# Patient Record
Sex: Female | Born: 1995 | Race: Black or African American | Hispanic: No | Marital: Single | Smoking: Never smoker
Health system: Southern US, Community
[De-identification: ages and names within clinical notes are randomized; demographics above are authoritative.]

## PROBLEM LIST (undated history)

## (undated) DIAGNOSIS — N83201 Unspecified ovarian cyst, right side: Secondary | ICD-10-CM

## (undated) DIAGNOSIS — N83202 Unspecified ovarian cyst, left side: Secondary | ICD-10-CM

## (undated) DIAGNOSIS — I059 Rheumatic mitral valve disease, unspecified: Secondary | ICD-10-CM

---
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
## 2016-10-20 ENCOUNTER — Encounter (HOSPITAL_COMMUNITY): Payer: Self-pay

## 2016-10-20 ENCOUNTER — Ambulatory Visit (HOSPITAL_COMMUNITY)
Admission: EM | Admit: 2016-10-20 | Discharge: 2016-10-20 | Disposition: A | Discharge status: Home / Self Care | Payer: Medicaid Other | Attending: Internal Medicine | Admitting: Internal Medicine

## 2016-10-20 DIAGNOSIS — S46812A Strain of other muscles, fascia and tendons at shoulder and upper arm level, left arm, initial encounter: Secondary | ICD-10-CM

## 2016-10-20 DIAGNOSIS — M7522 Bicipital tendinitis, left shoulder: Secondary | ICD-10-CM | POA: Diagnosis not present

## 2016-10-20 MED ORDER — NAPROXEN 500 MG PO TABS: 500.0000 mg | ORAL_TABLET | Freq: Two times a day (BID) | ORAL | 0 refills | Status: DC

## 2016-10-20 MED ORDER — CYCLOBENZAPRINE HCL 10 MG PO TABS: 10.0000 mg | ORAL_TABLET | Freq: Two times a day (BID) | ORAL | 0 refills | Status: DC | PRN

## 2016-10-20 NOTE — ED Triage Notes (Signed)
[facility], pt states she fell down 15 stairs today due to she just woke up and did turn on lights and tripped and fell down stairs, pt complains of pain in left arm and wrist. Pt has taken tylenol for pain.

## 2016-10-20 NOTE — ED Provider Notes (Signed)
CSN: [id number]     Arrival date & time 10/20/16  1842 History   First MD Initiated Contact with Patient 10/20/16 1909     Chief Complaint  Patient presents with  . Fall   (Consider location/radiation/quality/duration/timing/severity/associated sxs/prior Treatment) Patient c/o fall today down stairs and she is now having some left shoulder and arm discomfort.   The history is provided by the patient.  Fall  This is a new problem. The problem occurs constantly. The problem has not changed since onset.Nothing aggravates the symptoms. Nothing relieves the symptoms. She has tried nothing for the symptoms.    Past Medical History:  Diagnosis Date  . Bilateral ovarian cysts   . Mitral valve problem    History reviewed. No pertinent surgical history. History reviewed. No pertinent family history. Social History  Substance Use Topics  . Smoking status: Never Smoker  . Smokeless tobacco: Never Used  . Alcohol use No   OB History    No data available     Review of Systems  Constitutional: Negative.   HENT: Negative.   Eyes: Negative.   Respiratory: Negative.   Cardiovascular: Negative.   Gastrointestinal: Negative.   Endocrine: Negative.   Genitourinary: Negative.   Musculoskeletal: Positive for arthralgias.  Allergic/Immunologic: Negative.   Neurological: Negative.   Hematological: Negative.   Psychiatric/Behavioral: Negative.     Allergies  Vancomycin  Home Medications   Prior to Admission medications   Medication Sig Start Date End Date Taking? Authorizing Provider  loratadine (CLARITIN) 10 MG tablet Take 10 mg by mouth daily.   Yes [provider]  minocycline (MINOCIN,DYNACIN) 100 MG capsule Take 100 mg by mouth 2 (two) times daily.   Yes [provider]  amoxicillin (AMOXIL) 500 MG capsule Take 1 capsule (500 mg total) by mouth 3 (three) times daily. 06/08/15   Tharon AquasPatrick, Frank C, PA  cyclobenzaprine (FLEXERIL) 10 MG tablet Take 1 tablet (10 mg  total) by mouth 2 (two) times daily as needed for muscle spasms. 10/20/16   Deatra Canterxford, Bard Haupert J, FNP  cyclobenzaprine (FLEXERIL) 5 MG tablet Take 1 tablet (5 mg total) by mouth at bedtime as needed for muscle spasms. 12/26/13   Rodolph Bongorey, Evan S, MD  diclofenac (VOLTAREN) 50 MG EC tablet Take 1 tablet (50 mg total) by mouth 2 (two) times daily as needed. 12/26/13   Rodolph Bongorey, Evan S, MD  naproxen (NAPROSYN) 500 MG tablet Take 1 tablet (500 mg total) by mouth 2 (two) times daily with a meal. 10/20/16   Obryan Radu, Anselm PancoastWilliam J, FNP   Meds Ordered and Administered this Visit  Medications - No data to display  BP 102/66 (BP Location: Right Arm)   Pulse 71   Temp 99.5 F (37.5 C) (Oral)   Resp 16   SpO2 100%  No data found.   Physical Exam  Constitutional: She appears well-developed and well-nourished.  HENT:  Head: Normocephalic and atraumatic.  Eyes: Pupils are equal, round, and reactive to light. Conjunctivae and EOM are normal.  Neck: Normal range of motion. Neck supple.  Cardiovascular: Normal rate, regular rhythm and normal heart sounds.   Pulmonary/Chest: Effort normal and breath sounds normal.  Nursing note and vitals reviewed.   Urgent Care Course     Procedures (including critical care time)  Labs Review Labs Reviewed - No data to display  Imaging Review No results found.   Visual Acuity Review  Right Eye Distance:   Left Eye Distance:   Bilateral Distance:    Right Eye  Near:   Left Eye Near:    Bilateral Near:         MDM   1. Biceps tendonitis on left   2. Strain of left subscapularis muscle, initial encounter    Naprosyn 500mg  one po bid x 10 days Flexeril 10mg  one po bid prn #10    Deatra CanterOxford, Bryah Ocheltree J, FNP 10/20/16 2023

## 2017-05-27 ENCOUNTER — Ambulatory Visit (HOSPITAL_COMMUNITY)
Admission: EM | Admit: 2017-05-27 | Discharge: 2017-05-27 | Disposition: A | Discharge status: Home / Self Care | Payer: Medicaid Other

## 2017-05-27 ENCOUNTER — Encounter (HOSPITAL_COMMUNITY): Payer: Self-pay | Admitting: *Deleted

## 2017-05-27 ENCOUNTER — Other Ambulatory Visit: Payer: Self-pay

## 2017-05-27 DIAGNOSIS — R51 Headache: Secondary | ICD-10-CM

## 2017-05-27 DIAGNOSIS — R202 Paresthesia of skin: Secondary | ICD-10-CM

## 2017-05-27 DIAGNOSIS — M62838 Other muscle spasm: Secondary | ICD-10-CM

## 2017-05-27 DIAGNOSIS — G44209 Tension-type headache, unspecified, not intractable: Secondary | ICD-10-CM

## 2017-05-27 DIAGNOSIS — R200 Anesthesia of skin: Secondary | ICD-10-CM

## 2017-05-27 NOTE — ED Provider Notes (Signed)
  MRN: [id number] DOB: 01/26/1996  Subjective:   Madeline Cowan is a 22 y.o. female presenting for 2 day history of transient stabbing pains over frontal forehead. Has also had numbness in right arm, bilateral leg numbness, difficulty finding words. Reports locking of her thumbs in both hands. Has tried Tizanidine, was [facility] this by her neurologist [facility]. He is managing tension type headaches. Denies fevers, confusion, dizziness, sore throat, chest pain, abdominal pain, rashes. Denies smoking cigarettes. Does not hydrate well.  No current facility-administered medications for this encounter.   Current Outpatient Medications:  .  cyclobenzaprine (FLEXERIL) 5 MG tablet, Take 1 tablet (5 mg total) by mouth at bedtime as needed for muscle spasms., Disp: 20 tablet, Rfl: 0 .  rizatriptan (MAXALT) 10 MG tablet, Take 10 mg by mouth as needed for migraine. May repeat in 2 hours if needed, Disp: , Rfl:  .  verapamil (CALAN) 80 MG tablet, Take 80 mg by mouth 2 (two) times daily., Disp: , Rfl:  .  cyclobenzaprine (FLEXERIL) 10 MG tablet, Take 1 tablet (10 mg total) by mouth 2 (two) times daily as needed for muscle spasms., Disp: 20 tablet, Rfl: 0 .  loratadine (CLARITIN) 10 MG tablet, Take 10 mg by mouth daily., Disp: , Rfl:    Arna Mediciora is allergic to vancomycin.  Arna Mediciora  has a past medical history of Bilateral ovarian cysts and Mitral valve problem. Denies past surgical history.  Objective:   Vitals: BP 124/73 (BP Location: Right Arm)   Pulse 95   Temp 98.8 F (37.1 C) (Oral)   SpO2 100%   Physical Exam  Constitutional: She is oriented to person, place, and time. She appears well-developed and well-nourished.  HENT:  Mouth/Throat: Oropharynx is clear and moist.  Eyes: EOM are normal. Pupils are equal, round, and reactive to light. Right eye exhibits no discharge. Left eye exhibits no discharge. No scleral icterus.  Cardiovascular: Normal rate, regular rhythm and intact distal pulses.  Exam reveals no gallop and no friction rub.  No murmur heard. Pulmonary/Chest: No respiratory distress. She has no wheezes. She has no rales.  Musculoskeletal: She exhibits no edema.  Neurological: She is alert and oriented to person, place, and time. She displays normal reflexes. No cranial nerve deficit. Coordination normal.  Speech intact. Normal heel-to-shin, finger-to-nose tests. Balance intact. Strength 5/5 throughout.  Skin: Skin is warm and dry.  Psychiatric: She has a normal mood and affect.   Assessment and Plan :   Numbness  Tension headache  Muscle spasm  Right sided numbness  No acute findings, physical exam very reassuring. ER precautions given. Recommended adequate hydration, regular balanced meals. Follow up with neurologist.   Wallis BambergMani, Glenys Snader, PA-C 05/27/17 1405

## 2017-05-27 NOTE — Discharge Instructions (Signed)
Hydrate well with at least 2 liters (1 gallon) of water daily. You may take 500mg Tylenol with ibuprofen 400-600mg every 6 hours for pain and inflammation.  °

## 2017-05-27 NOTE — ED Triage Notes (Signed)
numbness in her right leg down to her ankle, per pt she is on new medication for headaches when she wakes up, sharp pains in her head, numbness in her right arm, issues with mixing up letters and sounds of words, both hand fingers get stuck in "weird" positions.

## 2018-11-21 ENCOUNTER — Encounter (HOSPITAL_COMMUNITY): Payer: Self-pay | Admitting: Emergency Medicine

## 2018-11-21 ENCOUNTER — Ambulatory Visit (HOSPITAL_COMMUNITY)
Admission: EM | Admit: 2018-11-21 | Discharge: 2018-11-21 | Disposition: A | Discharge status: Home / Self Care | Payer: Managed Care, Other (non HMO) | Attending: Urgent Care | Admitting: Urgent Care

## 2018-11-21 ENCOUNTER — Other Ambulatory Visit: Payer: Self-pay

## 2018-11-21 DIAGNOSIS — S46912A Strain of unspecified muscle, fascia and tendon at shoulder and upper arm level, left arm, initial encounter: Secondary | ICD-10-CM | POA: Diagnosis not present

## 2018-11-21 DIAGNOSIS — M545 Low back pain, unspecified: Secondary | ICD-10-CM

## 2018-11-21 DIAGNOSIS — M25512 Pain in left shoulder: Secondary | ICD-10-CM

## 2018-11-21 DIAGNOSIS — S39012A Strain of muscle, fascia and tendon of lower back, initial encounter: Secondary | ICD-10-CM | POA: Diagnosis not present

## 2018-11-21 MED ORDER — TIZANIDINE HCL 4 MG PO TABS: 4.0000 mg | ORAL_TABLET | Freq: Three times a day (TID) | ORAL | 0 refills | Status: DC | PRN

## 2018-11-21 MED ORDER — NAPROXEN 500 MG PO TABS: 500.0000 mg | ORAL_TABLET | Freq: Two times a day (BID) | ORAL | 0 refills | Status: DC
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
## 2019-04-01 ENCOUNTER — Ambulatory Visit: Payer: Medicaid Other | Attending: Internal Medicine

## 2019-04-01 DIAGNOSIS — Z20822 Contact with and (suspected) exposure to covid-19: Secondary | ICD-10-CM

## 2019-04-03 LAB — NOVEL CORONAVIRUS, NAA: SARS-CoV-2, NAA: NOT DETECTED

## 2019-04-14 ENCOUNTER — Other Ambulatory Visit: Payer: Medicaid Other

## 2019-12-12 ENCOUNTER — Other Ambulatory Visit: Payer: Medicaid Other

## 2020-04-03 DIAGNOSIS — Z1152 Encounter for screening for COVID-19: Secondary | ICD-10-CM | POA: Diagnosis not present

## 2020-07-26 DIAGNOSIS — Z3041 Encounter for surveillance of contraceptive pills: Secondary | ICD-10-CM | POA: Diagnosis not present

## 2020-12-04 DIAGNOSIS — Z20822 Contact with and (suspected) exposure to covid-19: Secondary | ICD-10-CM | POA: Diagnosis not present

## 2020-12-27 DIAGNOSIS — Z113 Encounter for screening for infections with a predominantly sexual mode of transmission: Secondary | ICD-10-CM | POA: Diagnosis not present

## 2020-12-27 DIAGNOSIS — Z114 Encounter for screening for human immunodeficiency virus [HIV]: Secondary | ICD-10-CM | POA: Diagnosis not present

## 2021-03-24 ENCOUNTER — Encounter (HOSPITAL_COMMUNITY): Payer: Self-pay | Admitting: Emergency Medicine

## 2021-03-24 ENCOUNTER — Emergency Department (HOSPITAL_COMMUNITY)
Admission: EM | Admit: 2021-03-24 | Discharge: 2021-03-25 | Disposition: A | Discharge status: Home / Self Care | Payer: BLUE CROSS/BLUE SHIELD | Attending: Emergency Medicine | Admitting: Emergency Medicine

## 2021-03-24 ENCOUNTER — Other Ambulatory Visit: Payer: Self-pay

## 2021-03-24 DIAGNOSIS — N200 Calculus of kidney: Secondary | ICD-10-CM

## 2021-03-24 DIAGNOSIS — N202 Calculus of kidney with calculus of ureter: Secondary | ICD-10-CM | POA: Insufficient documentation

## 2021-03-24 DIAGNOSIS — Z79899 Other long term (current) drug therapy: Secondary | ICD-10-CM | POA: Diagnosis not present

## 2021-03-24 DIAGNOSIS — R109 Unspecified abdominal pain: Secondary | ICD-10-CM | POA: Diagnosis not present

## 2021-03-24 LAB — CBC WITH DIFFERENTIAL/PLATELET
Abs Immature Granulocytes: 0.01 10*3/uL (ref 0.00–0.07)
Basophils Absolute: 0 10*3/uL (ref 0.0–0.1)
Basophils Relative: 1 %
Eosinophils Absolute: 0 10*3/uL (ref 0.0–0.5)
Eosinophils Relative: 0 %
HCT: 37.9 % (ref 36.0–46.0)
Hemoglobin: 11.7 g/dL — ABNORMAL LOW (ref 12.0–15.0)
Immature Granulocytes: 0 %
Lymphocytes Relative: 40 %
Lymphs Abs: 1.8 10*3/uL (ref 0.7–4.0)
MCH: 27.6 pg (ref 26.0–34.0)
MCHC: 30.9 g/dL (ref 30.0–36.0)
MCV: 89.4 fL (ref 80.0–100.0)
Monocytes Absolute: 0.6 10*3/uL (ref 0.1–1.0)
Monocytes Relative: 12 %
Neutro Abs: 2.2 10*3/uL (ref 1.7–7.7)
Neutrophils Relative %: 47 %
Platelets: 239 10*3/uL (ref 150–400)
RBC: 4.24 MIL/uL (ref 3.87–5.11)
RDW: 14.1 % (ref 11.5–15.5)
WBC: 4.6 10*3/uL (ref 4.0–10.5)
nRBC: 0 % (ref 0.0–0.2)

## 2021-03-24 LAB — I-STAT BETA HCG BLOOD, ED (MC, WL, AP ONLY): I-stat hCG, quantitative: <5 m[IU]/mL (ref <5)

## 2021-03-24 NOTE — ED Triage Notes (Signed)
Patient reports RLQ abdominal pain with nausea and diarrhea/constipation onset yesterday .

## 2021-03-24 NOTE — ED Provider Notes (Signed)
Emergency Medicine Provider Triage Evaluation Note  Madeline Cowan , a 25 y.o. female  was evaluated in triage.  Pt complains of RLQ abdominal pain.  States subtle pain for the past 2 days but worse now.  She reports associated diarrhea and chills.  No fever. No prior surgeries..  Review of Systems  Positive: RLQ pain Negative: fever  Physical Exam  BP (!) 137/97    Pulse 80    Temp 98.6 F (37 C) (Oral)    Resp 16    Ht 5\' 6"  (1.676 m)    Wt 63 kg    LMP 03/07/2021    SpO2 100%    BMI 22.42 kg/m  Gen:   Awake, no distress   Resp:  Normal effort  MSK:   Moves extremities without difficulty  Other:  RLQ pain   Medical Decision Making  Medically screening exam initiated at 10:49 PM.  Appropriate orders placed.  Latania Bascomb was informed that the remainder of the evaluation will be completed by another provider, this initial triage assessment does not replace that evaluation, and the importance of remaining in the ED until their evaluation is complete.  RLQ pain.  Seems uncomfortable in triage.  Will check labs, CT for appendicitis.   Guy Sandifer, PA-C 03/24/21 2251    03/26/21, MD 03/25/21 [id number]

## 2021-03-25 ENCOUNTER — Emergency Department (HOSPITAL_COMMUNITY): Payer: BLUE CROSS/BLUE SHIELD

## 2021-03-25 DIAGNOSIS — N200 Calculus of kidney: Secondary | ICD-10-CM | POA: Diagnosis not present

## 2021-03-25 DIAGNOSIS — R109 Unspecified abdominal pain: Secondary | ICD-10-CM | POA: Diagnosis not present

## 2021-03-25 LAB — COMPREHENSIVE METABOLIC PANEL
ALT: 18 U/L (ref 0–44)
AST: 17 U/L (ref 15–41)
Albumin: 4.2 g/dL (ref 3.5–5.0)
Alkaline Phosphatase: 67 U/L (ref 38–126)
Anion gap: 7 (ref 5–15)
BUN: 8 mg/dL (ref 6–20)
CO2: 25 mmol/L (ref 22–32)
Calcium: 9.7 mg/dL (ref 8.9–10.3)
Chloride: 105 mmol/L (ref 98–111)
Creatinine, Ser: 0.9 mg/dL (ref 0.44–1.00)
GFR, Estimated: >60 mL/min (ref >60)
Glucose, Bld: 98 mg/dL (ref 70–99)
Potassium: 3.7 mmol/L (ref 3.5–5.1)
Sodium: 137 mmol/L (ref 135–145)
Total Bilirubin: 0.6 mg/dL (ref 0.3–1.2)
Total Protein: 6.9 g/dL (ref 6.5–8.1)

## 2021-03-25 LAB — URINALYSIS, ROUTINE W REFLEX MICROSCOPIC
Bacteria, UA: NONE SEEN
Bilirubin Urine: NEGATIVE
Glucose, UA: NEGATIVE mg/dL
Ketones, ur: 5 mg/dL — AB
Leukocytes,Ua: NEGATIVE
Nitrite: NEGATIVE
Protein, ur: 100 mg/dL — AB
RBC / HPF: >50 RBC/hpf — ABNORMAL HIGH (ref 0–5)
Specific Gravity, Urine: 1.034 — ABNORMAL HIGH (ref 1.005–1.030)
pH: 5 (ref 5.0–8.0)

## 2021-03-25 LAB — LIPASE, BLOOD: Lipase: 35 U/L (ref 11–51)

## 2021-03-25 MED ORDER — ONDANSETRON 8 MG PO TBDP: 8.0000 mg | ORAL_TABLET | Freq: Three times a day (TID) | ORAL | 0 refills | Status: DC | PRN

## 2021-03-25 MED ORDER — IOHEXOL 300 MG/ML  SOLN
100.0000 mL | Freq: Once | INTRAMUSCULAR | Status: AC | PRN
  Administered 2021-03-25: 03:00:00 100 mL via INTRAVENOUS

## 2021-03-25 MED ORDER — KETOROLAC TROMETHAMINE 30 MG/ML IJ SOLN
30.0000 mg | Freq: Once | INTRAMUSCULAR | Status: AC
  Administered 2021-03-25: 07:00:00 30 mg via INTRAVENOUS
  Filled 2021-03-25: qty 1

## 2021-03-25 MED ORDER — ONDANSETRON HCL 4 MG/2ML IJ SOLN
4.0000 mg | Freq: Once | INTRAMUSCULAR | Status: AC
  Administered 2021-03-25: 07:00:00 4 mg via INTRAVENOUS
  Filled 2021-03-25: qty 2

## 2021-03-25 MED ORDER — HYDROCODONE-ACETAMINOPHEN 5-325 MG PO TABS: 1.0000 | ORAL_TABLET | Freq: Four times a day (QID) | ORAL | 0 refills | Status: DC | PRN

## 2021-03-25 MED ORDER — OXYCODONE-ACETAMINOPHEN 5-325 MG PO TABS
1.0000 | ORAL_TABLET | Freq: Once | ORAL | Status: AC
  Administered 2021-03-25: 07:00:00 1 via ORAL
  Filled 2021-03-25: qty 1

## 2021-03-25 NOTE — Discharge Instructions (Addendum)
Use the urine strainer as we discussed.  Follow-up with urologist if your symptoms persist.  Take the medications for pain and nausea as needed.

## 2021-03-25 NOTE — ED Provider Notes (Signed)
[facility] EMERGENCY DEPARTMENT Provider Note   CSN: [id number] Arrival date & time: 03/24/21  2209     History Chief Complaint  Patient presents with   Abdominal Pain    Madeline Cowan is a 25 y.o. female.   Abdominal Pain  Patient presented to the emergency room with complaints of abdominal pain.  Patient has had some discomfort over the last couple of weeks with it was rather mild.  Last night however she had sharp intense severe pain.  The pains in the right lower part of her abdomen.  She has had some pain in the back as well.  She had some nausea but no vomiting.  No fevers or chills.  Patient has never had anything like this before  Past Medical History:  Diagnosis Date   Bilateral ovarian cysts    Mitral valve problem     There are no problems to display for this patient.   History reviewed. No pertinent surgical history.   OB History   No obstetric history on file.     No family history on file.  Social History   Tobacco Use   Smoking status: Never   Smokeless tobacco: Never  Vaping Use   Vaping Use: Never used  Substance Use Topics   Alcohol use: No   Drug use: No    Home Medications Prior to Admission medications   Medication Sig Start Date End Date Taking? Authorizing Provider  Aspirin-Acetaminophen-Caffeine (PAMPRIN MAX PO) Take 1 tablet by mouth daily as needed (pain, cramps).   Yes [provider]  HYDROcodone-acetaminophen (NORCO/VICODIN) 5-325 MG tablet Take 1 tablet by mouth every 6 (six) hours as needed. 03/25/21  Yes Linwood Dibbles, MD  ondansetron (ZOFRAN-ODT) 8 MG disintegrating tablet Take 1 tablet (8 mg total) by mouth every 8 (eight) hours as needed for nausea or vomiting. 03/25/21  Yes Linwood Dibbles, MD  cyclobenzaprine (FLEXERIL) 10 MG tablet Take 1 tablet (10 mg total) by mouth 2 (two) times daily as needed for muscle spasms. Patient not taking: Reported on 11/21/2018 10/20/16   Deatra Canter, FNP   cyclobenzaprine (FLEXERIL) 5 MG tablet Take 1 tablet (5 mg total) by mouth at bedtime as needed for muscle spasms. Patient not taking: Reported on 11/21/2018 12/26/13   Rodolph Bong, MD  loratadine (CLARITIN) 10 MG tablet Take 10 mg by mouth daily. Patient not taking: Reported on 03/25/2021    [provider]  naproxen (NAPROSYN) 500 MG tablet Take 1 tablet (500 mg total) by mouth 2 (two) times daily. Patient not taking: Reported on 03/25/2021 11/21/18   Wallis Bamberg, PA-C  rizatriptan (MAXALT) 10 MG tablet Take 10 mg by mouth as needed for migraine. May repeat in 2 hours if needed Patient not taking: Reported on 03/25/2021    [provider]  tiZANidine (ZANAFLEX) 4 MG tablet Take 1 tablet (4 mg total) by mouth every 8 (eight) hours as needed for muscle spasms. Patient not taking: Reported on 03/25/2021 11/21/18   Wallis Bamberg, PA-C  verapamil (CALAN) 80 MG tablet Take 80 mg by mouth 2 (two) times daily. Patient not taking: Reported on 03/25/2021    [provider]    Allergies    Vancomycin  Review of Systems   Review of Systems  Gastrointestinal:  Positive for abdominal pain.  All other systems reviewed and are negative.  Physical Exam Updated Vital Signs BP (!) 115/47    Pulse 62    Temp 98.5 F (36.9 C) (  Oral)    Resp 16    Ht 1.676 m (5\' 6" )    Wt 63 kg    LMP 03/07/2021    SpO2 100%    BMI 22.42 kg/m   Physical Exam Vitals and nursing note reviewed.  Constitutional:      General: She is not in acute distress.    Appearance: She is well-developed.  HENT:     Head: Normocephalic and atraumatic.     Right Ear: External ear normal.     Left Ear: External ear normal.  Eyes:     General: No scleral icterus.       Right eye: No discharge.        Left eye: No discharge.     Conjunctiva/sclera: Conjunctivae normal.  Neck:     Trachea: No tracheal deviation.  Cardiovascular:     Rate and Rhythm: Normal rate and regular rhythm.  Pulmonary:      Effort: Pulmonary effort is normal. No respiratory distress.     Breath sounds: Normal breath sounds. No stridor. No wheezing or rales.  Abdominal:     General: Bowel sounds are normal. There is no distension.     Palpations: Abdomen is soft.     Tenderness: There is no abdominal tenderness. There is no guarding or rebound.  Musculoskeletal:        General: No tenderness or deformity.     Cervical back: Neck supple.  Skin:    General: Skin is warm and dry.     Findings: No rash.  Neurological:     General: No focal deficit present.     Mental Status: She is alert.     Cranial Nerves: No cranial nerve deficit (no facial droop, extraocular movements intact, no slurred speech).     Sensory: No sensory deficit.     Motor: No abnormal muscle tone or seizure activity.     Coordination: Coordination normal.  Psychiatric:        Mood and Affect: Mood normal.    ED Results / Procedures / Treatments   Labs (all labs ordered are listed, but only abnormal results are displayed) Labs Reviewed  CBC WITH DIFFERENTIAL/PLATELET - Abnormal; Notable for the following components:      Result Value   Hemoglobin 11.7 (*)    All other components within normal limits  URINALYSIS, ROUTINE W REFLEX MICROSCOPIC - Abnormal; Notable for the following components:   Color, Urine AMBER (*)    APPearance CLOUDY (*)    Specific Gravity, Urine 1.034 (*)    Hgb urine dipstick LARGE (*)    Ketones, ur 5 (*)    Protein, ur 100 (*)    RBC / HPF >50 (*)    All other components within normal limits  COMPREHENSIVE METABOLIC PANEL  LIPASE, BLOOD  I-STAT BETA HCG BLOOD, ED ([facility], [facility], [facility] ONLY)    EKG None  Radiology CT ABDOMEN PELVIS W CONTRAST  Result Date: 03/25/2021 CLINICAL DATA:  Right lower quadrant abdominal pain. EXAM: CT ABDOMEN AND PELVIS WITH CONTRAST TECHNIQUE: Multidetector CT imaging of the abdomen and pelvis was performed using the standard protocol following bolus administration of intravenous  contrast. CONTRAST:  03/27/2021 OMNIPAQUE IOHEXOL 300 MG/ML  SOLN COMPARISON:  None FINDINGS: Lower chest: The visualized lung bases are clear. No intra-abdominal free air or free fluid. Hepatobiliary: No focal liver abnormality is seen. No gallstones, gallbladder wall thickening, or biliary dilatation. Pancreas: Unremarkable. No pancreatic ductal dilatation or surrounding inflammatory changes. Spleen: Normal in  size without focal abnormality. Adrenals/Urinary Tract: The adrenal glands unremarkable. There is a 4 mm stone in the proximal right ureter with mild right hydronephrosis. Several additional nonobstructing right renal calculi measure up to mm. The left kidney is unremarkable. The urinary bladder is grossly unremarkable. Stomach/Bowel: There is no bowel obstruction or active inflammation. The appendix is normal. Vascular/Lymphatic: The abdominal aorta and IVC unremarkable. No portal venous gas. There is no adenopathy. Reproductive: The uterus is anteverted.  No adnexal masses. Other: None Musculoskeletal: No acute or significant osseous findings. IMPRESSION: 1. A 4 mm proximal right ureteral stone with mild right hydronephrosis. Several additional nonobstructing right renal calculi measure up to mm. 2. No bowel obstruction. Normal appendix. Electronically Signed   By: Elgie Collard M.D.   On: 03/25/2021 02:37    Procedures Procedures   Medications Ordered in ED Medications  iohexol (OMNIPAQUE) 300 MG/ML solution 100 mL (100 mLs Intravenous Contrast Given 03/25/21 0230)  oxyCODONE-acetaminophen (PERCOCET/ROXICET) 5-325 MG per tablet 1 tablet (1 tablet Oral Given 03/25/21 3154)  ketorolac (TORADOL) 30 MG/ML injection 30 mg (30 mg Intravenous Given 03/25/21 0723)  ondansetron (ZOFRAN) injection 4 mg (4 mg Intravenous Given 03/25/21 0086)    ED Course  I have reviewed the triage vital signs and the nursing notes.  Pertinent labs & imaging results that were available during my care of the patient  were reviewed by me and considered in my medical decision making (see chart for details).    MDM Rules/Calculators/A&P                         Patient presented to the ER for evaluation of right-sided abdominal pain.  Presentation concerning for the possibility of appendicitis.  Ureteral colic was also a consideration.  Patient's laboratory test show normal CBC and metabolic panel.  No signs of pancreatitis or hepatitis.  Patient is not pregnant.  Urinalysis did show large amount of blood but no signs of infection.  CT scan shows a right-sided ureteral stone.  Patient's symptoms have mostly resolved while she was waiting for evaluation.  Patient was given a dose of Toradol and Zofran.  Discussed findings with the patient and the fact that she does have other kidney stones.  Will discharge home with medications for pain and nausea.  Discussed outpatient follow-up with urology and use the urine strainer.    Final Clinical Impression(s) / ED Diagnoses Final diagnoses:  Kidney stone    Rx / DC Orders ED Discharge Orders          Ordered    HYDROcodone-acetaminophen (NORCO/VICODIN) 5-325 MG tablet  Every 6 hours PRN        03/25/21 0737    ondansetron (ZOFRAN-ODT) 8 MG disintegrating tablet  Every 8 hours PRN        03/25/21 0737             Linwood Dibbles, MD 03/25/21 [phone]

## 2021-03-29 DIAGNOSIS — U071 COVID-19: Secondary | ICD-10-CM | POA: Diagnosis not present

## 2021-03-29 DIAGNOSIS — Z20822 Contact with and (suspected) exposure to covid-19: Secondary | ICD-10-CM | POA: Diagnosis not present

## 2021-04-02 DIAGNOSIS — Z20822 Contact with and (suspected) exposure to covid-19: Secondary | ICD-10-CM | POA: Diagnosis not present

## 2021-04-02 DIAGNOSIS — Z03818 Encounter for observation for suspected exposure to other biological agents ruled out: Secondary | ICD-10-CM | POA: Diagnosis not present

## 2021-04-28 DIAGNOSIS — R109 Unspecified abdominal pain: Secondary | ICD-10-CM | POA: Diagnosis not present

## 2021-04-28 DIAGNOSIS — D649 Anemia, unspecified: Secondary | ICD-10-CM | POA: Diagnosis not present

## 2021-04-28 DIAGNOSIS — Z87442 Personal history of urinary calculi: Secondary | ICD-10-CM | POA: Diagnosis not present

## 2021-04-28 DIAGNOSIS — R1031 Right lower quadrant pain: Secondary | ICD-10-CM | POA: Diagnosis not present

## 2021-04-28 DIAGNOSIS — Z23 Encounter for immunization: Secondary | ICD-10-CM | POA: Diagnosis not present

## 2021-04-29 DIAGNOSIS — D72819 Decreased white blood cell count, unspecified: Secondary | ICD-10-CM | POA: Insufficient documentation

## 2021-05-17 DIAGNOSIS — D72819 Decreased white blood cell count, unspecified: Secondary | ICD-10-CM | POA: Diagnosis not present

## 2021-07-07 DIAGNOSIS — D649 Anemia, unspecified: Secondary | ICD-10-CM | POA: Diagnosis not present

## 2021-07-07 DIAGNOSIS — Z0000 Encounter for general adult medical examination without abnormal findings: Secondary | ICD-10-CM | POA: Diagnosis not present

## 2021-07-07 DIAGNOSIS — Z30013 Encounter for initial prescription of injectable contraceptive: Secondary | ICD-10-CM | POA: Diagnosis not present

## 2021-07-07 DIAGNOSIS — R1031 Right lower quadrant pain: Secondary | ICD-10-CM | POA: Diagnosis not present

## 2021-07-07 DIAGNOSIS — Z23 Encounter for immunization: Secondary | ICD-10-CM | POA: Diagnosis not present

## 2021-07-07 DIAGNOSIS — Z13220 Encounter for screening for lipoid disorders: Secondary | ICD-10-CM | POA: Diagnosis not present

## 2021-07-07 DIAGNOSIS — Z124 Encounter for screening for malignant neoplasm of cervix: Secondary | ICD-10-CM | POA: Diagnosis not present

## 2021-07-07 DIAGNOSIS — Z113 Encounter for screening for infections with a predominantly sexual mode of transmission: Secondary | ICD-10-CM | POA: Diagnosis not present

## 2021-07-08 DIAGNOSIS — Z3042 Encounter for surveillance of injectable contraceptive: Secondary | ICD-10-CM | POA: Diagnosis not present

## 2021-08-03 DIAGNOSIS — N202 Calculus of kidney with calculus of ureter: Secondary | ICD-10-CM | POA: Diagnosis not present

## 2021-08-08 DIAGNOSIS — R5383 Other fatigue: Secondary | ICD-10-CM | POA: Diagnosis not present

## 2021-08-08 DIAGNOSIS — D509 Iron deficiency anemia, unspecified: Secondary | ICD-10-CM | POA: Diagnosis not present

## 2021-08-10 ENCOUNTER — Telehealth: Payer: Self-pay | Admitting: Hematology and Oncology

## 2021-08-10 NOTE — Telephone Encounter (Signed)
Scheduled appt per 5/17 referral. Pt is aware of appt date and time. Pt is aware to arrive 15 mins prior to appt time and to bring and updated insurance card. Pt is aware of appt location.   

## 2021-08-24 ENCOUNTER — Other Ambulatory Visit: Payer: Self-pay

## 2021-08-24 ENCOUNTER — Inpatient Hospital Stay: Payer: BC Managed Care – PPO | Attending: Hematology and Oncology | Admitting: Hematology and Oncology

## 2021-08-24 ENCOUNTER — Inpatient Hospital Stay: Payer: BC Managed Care – PPO

## 2021-08-24 VITALS — BP 114/62 | HR 62 | Temp 97.8°F | Resp 15 | Ht 66.0 in | Wt 125.5 lb

## 2021-08-24 DIAGNOSIS — D709 Neutropenia, unspecified: Secondary | ICD-10-CM

## 2021-08-24 DIAGNOSIS — D509 Iron deficiency anemia, unspecified: Secondary | ICD-10-CM

## 2021-08-24 DIAGNOSIS — Z87442 Personal history of urinary calculi: Secondary | ICD-10-CM | POA: Insufficient documentation

## 2021-08-24 DIAGNOSIS — Z8616 Personal history of COVID-19: Secondary | ICD-10-CM | POA: Insufficient documentation

## 2021-08-24 DIAGNOSIS — Z793 Long term (current) use of hormonal contraceptives: Secondary | ICD-10-CM | POA: Insufficient documentation

## 2021-08-24 DIAGNOSIS — R634 Abnormal weight loss: Secondary | ICD-10-CM | POA: Insufficient documentation

## 2021-08-24 DIAGNOSIS — N920 Excessive and frequent menstruation with regular cycle: Secondary | ICD-10-CM | POA: Insufficient documentation

## 2021-08-24 DIAGNOSIS — Z87891 Personal history of nicotine dependence: Secondary | ICD-10-CM | POA: Diagnosis not present

## 2021-08-24 DIAGNOSIS — D500 Iron deficiency anemia secondary to blood loss (chronic): Secondary | ICD-10-CM | POA: Insufficient documentation

## 2021-08-24 DIAGNOSIS — D72819 Decreased white blood cell count, unspecified: Secondary | ICD-10-CM | POA: Diagnosis not present

## 2021-08-24 LAB — CBC WITH DIFFERENTIAL (CANCER CENTER ONLY)
Abs Immature Granulocytes: 0 10*3/uL (ref 0.00–0.07)
Basophils Absolute: 0 10*3/uL (ref 0.0–0.1)
Basophils Relative: 2 %
Eosinophils Absolute: 0.1 10*3/uL (ref 0.0–0.5)
Eosinophils Relative: 5 %
HCT: 37.1 % (ref 36.0–46.0)
Hemoglobin: 12 g/dL (ref 12.0–15.0)
Immature Granulocytes: 0 %
Lymphocytes Relative: 45 %
Lymphs Abs: 1.2 10*3/uL (ref 0.7–4.0)
MCH: 28.4 pg (ref 26.0–34.0)
MCHC: 32.3 g/dL (ref 30.0–36.0)
MCV: 87.7 fL (ref 80.0–100.0)
Monocytes Absolute: 0.2 10*3/uL (ref 0.1–1.0)
Monocytes Relative: 8 %
Neutro Abs: 1.1 10*3/uL — ABNORMAL LOW (ref 1.7–7.7)
Neutrophils Relative %: 40 %
Platelet Count: 228 10*3/uL (ref 150–400)
RBC: 4.23 MIL/uL (ref 3.87–5.11)
RDW: 13.2 % (ref 11.5–15.5)
WBC Count: 2.6 10*3/uL — ABNORMAL LOW (ref 4.0–10.5)
nRBC: 0 % (ref 0.0–0.2)

## 2021-08-24 LAB — CMP (CANCER CENTER ONLY)
ALT: 14 U/L (ref 0–44)
AST: 14 U/L — ABNORMAL LOW (ref 15–41)
Albumin: 4.7 g/dL (ref 3.5–5.0)
Alkaline Phosphatase: 51 U/L (ref 38–126)
Anion gap: 5 (ref 5–15)
BUN: 8 mg/dL (ref 6–20)
CO2: 28 mmol/L (ref 22–32)
Calcium: 10.2 mg/dL (ref 8.9–10.3)
Chloride: 106 mmol/L (ref 98–111)
Creatinine: 0.79 mg/dL (ref 0.44–1.00)
GFR, Estimated: >60 mL/min (ref >60)
Glucose, Bld: 97 mg/dL (ref 70–99)
Potassium: 3.8 mmol/L (ref 3.5–5.1)
Sodium: 139 mmol/L (ref 135–145)
Total Bilirubin: 0.5 mg/dL (ref 0.3–1.2)
Total Protein: 7.5 g/dL (ref 6.5–8.1)

## 2021-08-24 LAB — HEPATITIS B SURFACE ANTIGEN: Hepatitis B Surface Ag: NONREACTIVE

## 2021-08-24 LAB — FOLATE: Folate: 16.7 ng/mL (ref >5.9)

## 2021-08-24 LAB — VITAMIN B12: Vitamin B-12: 316 pg/mL (ref 180–914)

## 2021-08-24 LAB — HEPATITIS B SURFACE ANTIBODY,QUALITATIVE: Hep B S Ab: NONREACTIVE

## 2021-08-24 LAB — SEDIMENTATION RATE: Sed Rate: 6 mm/hr (ref 0–22)

## 2021-08-24 LAB — HEPATITIS C ANTIBODY: HCV Ab: NONREACTIVE

## 2021-08-24 LAB — TSH: TSH: 1.329 u[IU]/mL (ref 0.350–4.500)

## 2021-08-24 LAB — C-REACTIVE PROTEIN: CRP: 0.5 mg/dL (ref <1.0)

## 2021-08-24 LAB — HIV ANTIBODY (ROUTINE TESTING W REFLEX): HIV Screen 4th Generation wRfx: NONREACTIVE

## 2021-08-24 LAB — HEPATITIS B CORE ANTIBODY, TOTAL: Hep B Core Total Ab: NONREACTIVE

## 2021-08-24 NOTE — Progress Notes (Signed)
[place] Telephone:([phone]) [phone]   Fax:([phone]) [phone]  INITIAL CONSULT NOTE  Patient Care Team: Patient, No Pcp Per (Inactive) as PCP - General (General Practice)  Hematological/Oncological History # Leukopenia/Neutropenia # Normocytic Anemia  08/08/2021: TIBC 381, Ferritin 14, Iron Sat 32%, WBC 2.6, Hgb 11.5, MCV 87.2, Plt 224, ANC 900 08/24/2021: establish care with Dr. Lorenso Courier  CHIEF COMPLAINTS/PURPOSE OF CONSULTATION:  " Leukopenia/Neutropenia "  HISTORY OF PRESENTING ILLNESS:  Madeline Cowan 26 y.o. female with medical history significant for iron deficiency anemia who presents for evaluation of leukopenia.   On review of the previous records Madeline Cowan had labs drawn on 08/08/2021 at which time she was noted to have TIBC 381, Ferritin 14, Iron Sat 32%, WBC 2.6, Hgb 11.5, MCV 87.2, Plt 224, ANC 900.   On exam today Madeline Cowan is a 26 year old female who presents to the hematology clinic for initial evaluation of neutropenia, and Fe deficiency anemia. She reports symptoms of fatigue, dizziness, cold intolerance, and appetite loss which began in February when she was diagnosed with Fe deficiency anemia and started on oral iron daily. She is compliant and has been taking this with orange juice. She endorses alternating diarrhea (morning) and constipation (evening) daily. Her diet is iron rich with red meat and leafy green vegetables when she has an appetite. She reports a 30 pound weight loss since December with success in gaining 7 pounds back. She also reports minor RLQ and LLQ abdominal pain that has persisted since an episode of kidney stones in December. She also reports she had Covid in January.  She has a history of heavy menstrual bleeding starting at the age of 66. Her periods lasted 10 days with 5 of the days being heavy. During these 5 days, she would use about 8 super tampons or 6 heavy pads a day. She started DepoProvera in April with success in  decreasing her period to only minor spotting. She is positive for fatigue, 30 pound weight loss, appetite loss, cold intolerance, cyclic diarrhea and constipation, RLQ and LLQ abdominal pain, and dizziness. She is negative for fever, chills, shortness of breath, bruising, pica, hematochezia, and urinary symptoms. A full 10 point ROS was otherwise negative.   MEDICAL HISTORY:  Past Medical History:  Diagnosis Date   Bilateral ovarian cysts    Mitral valve problem     SURGICAL HISTORY: No past surgical history on file.  SOCIAL HISTORY: Social History   Socioeconomic History   Marital status: Single    Spouse name: Not on file   Number of children: Not on file   Years of education: Not on file   Highest education level: Not on file  Occupational History   Not on file  Tobacco Use   Smoking status: Never   Smokeless tobacco: Never  Vaping Use   Vaping Use: Never used  Substance and Sexual Activity   Alcohol use: No   Drug use: No   Sexual activity: Not on file  Other Topics Concern   Not on file  Social History Narrative   Not on file   Social Determinants of Health   Financial Resource Strain: Not on file  Food Insecurity: Not on file  Transportation Needs: Not on file  Physical Activity: Not on file  Stress: Not on file  Social Connections: Not on file  Intimate Partner Violence: Not on file    FAMILY HISTORY: No family history on file.  ALLERGIES:  is allergic to vancomycin.  MEDICATIONS:  Current  Outpatient Medications  Medication Sig Dispense Refill   medroxyPROGESTERone Acetate 150 MG/ML SUSY 1 mL     Aspirin-Acetaminophen-Caffeine (PAMPRIN MAX PO) Take 1 tablet by mouth daily as needed (pain, cramps).     ferrous sulfate 325 (65 FE) MG EC tablet Take 1 tablet by mouth daily.     ondansetron (ZOFRAN-ODT) 8 MG disintegrating tablet Take 1 tablet (8 mg total) by mouth every 8 (eight) hours as needed for nausea or vomiting. (Patient not taking: Reported on  08/24/2021) 12 tablet 0   Vitamin D, Ergocalciferol, (DRISDOL) 1.25 MG (50000 UNIT) CAPS capsule Take 50,000 Units by mouth once a week.     No current facility-administered medications for this visit.    REVIEW OF SYSTEMS:   Constitutional: ( - ) fevers, ( - )  chills , ( - ) night sweats Eyes: ( - ) blurriness of vision, ( - ) double vision, ( - ) watery eyes Ears, nose, mouth, throat, and face: ( - ) mucositis, ( - ) sore throat Respiratory: ( - ) cough, ( - ) dyspnea, ( - ) wheezes Cardiovascular: ( - ) palpitation, ( - ) chest discomfort, ( - ) lower extremity swelling Gastrointestinal:  ( - ) nausea, ( - ) heartburn, ( - ) change in bowel habits Skin: ( - ) abnormal skin rashes Lymphatics: ( - ) new lymphadenopathy, ( - ) easy bruising Neurological: ( - ) numbness, ( - ) tingling, ( - ) new weaknesses Behavioral/Psych: ( - ) mood change, ( - ) new changes  All other systems were reviewed with the patient and are negative.  PHYSICAL EXAMINATION:  Vitals:   08/24/21 0839  BP: 114/62  Pulse: 62  Resp: 15  Temp: 97.8 F (36.6 C)  SpO2: 100%   Filed Weights   08/24/21 0839  Weight: 125 lb 8 oz (56.9 kg)    GENERAL: well appearing young African American female in NAD  SKIN: skin color, texture, turgor are normal, no rashes or significant lesions EYES: conjunctiva are pink and non-injected, sclera clear LUNGS: clear to auscultation and percussion with normal breathing effort HEART: regular rate & rhythm and no murmurs and no lower extremity edema Musculoskeletal: no cyanosis of digits and no clubbing  PSYCH: alert & oriented x 3, fluent speech NEURO: no focal motor/sensory deficits  LABORATORY DATA:  I have reviewed the data as listed    Latest Ref Rng & Units 08/24/2021    9:44 AM 03/24/2021   10:57 PM 10/27/2009    5:39 PM  CBC  WBC 4.0 - 10.5 K/uL 2.6   4.6   5.5    Hemoglobin 12.0 - 15.0 g/dL 12.0   11.7   12.5    Hematocrit 36.0 - 46.0 % 37.1   37.9   37.9     Platelets 150 - 400 K/uL 228   239   230         Latest Ref Rng & Units 08/24/2021    9:44 AM 03/24/2021   11:30 PM 10/27/2009    5:39 PM  CMP  Glucose 70 - 99 mg/dL 97   98   97    BUN 6 - 20 mg/dL $Remove'8   8   7    'DGCHNeE$ Creatinine 0.44 - 1.00 mg/dL 0.79   0.90   0.54    Sodium 135 - 145 mmol/L 139   137   133    Potassium 3.5 - 5.1 mmol/L 3.8   3.7  3.5    Chloride 98 - 111 mmol/L 106   105   99    CO2 22 - 32 mmol/L $RemoveB'28   25   26    'CwSTcWkg$ Calcium 8.9 - 10.3 mg/dL 10.2   9.7   9.4    Total Protein 6.5 - 8.1 g/dL 7.5   6.9     Total Bilirubin 0.3 - 1.2 mg/dL 0.5   0.6     Alkaline Phos 38 - 126 U/L 51   67     AST 15 - 41 U/L 14   17     ALT 0 - 44 U/L 14   18        ASSESSMENT & PLAN Madeline Cowan 26 y.o. female with medical history significant for iron deficiency anemia who presents for evaluation of leukopenia.   After review of the labs, review of the records, and discussion with the patient the patients findings are most consistent with an isolated leukopenia of unclear etiology.   The differential for neutropenia includes nutritional deficiency, inflammatory disorder, medication side effect, infectious etiology, or congenital condition (such as benign ethnic neutropenia). The patient is not taking any medications known to cause neutropenia. Workup for this condition includes viral serologies (HIV, Hep B and Hep C),  vitamin b12/folate, and inflammatory markers with ESR and CRP.    A common cause for low WBC is benign ethnic neutropenia (BEN). This is a condition whereby individuals of African or Mediterranean descent have lower WBC than the general population. The condition typically has an absolute neutrophil count (ANC) between 1000-1500 with no recurrent infections. Patient with this condition have normal functioning immune systems and have no consequences as a result of their low ANC. BEN is a diagnosis of exclusion, so it would require the full above workup to make.     #Neutropenia/Leukopenia --repeat CBC and CMP  --infectious serology testing with Hep B, Hep C, and HIV  --nutritional evaluation with Vitamin b12, folate  --inflammatory workup with ESR and CRP  --RTC in 6 month's time or sooner if there is an issue with the above labs.   # Iron Deficiency Anemia 2/2 to GYN Bleeding --noted to have TIBC 381, Ferritin 14, Iron Sat 32% with Hgb 11.5 on last labs checked.  --patient is taking PO ferrous sulfate 325 mg daily.   Orders Placed This Encounter  Procedures   CBC with Differential ([place] Only)    Standing Status:   Future    Number of Occurrences:   1    Standing Expiration Date:   08/25/2022   CMP ([place] only)    Standing Status:   Future    Number of Occurrences:   1    Standing Expiration Date:   08/25/2022   TSH    Standing Status:   Future    Number of Occurrences:   1    Standing Expiration Date:   08/25/2022   Vitamin B12    Standing Status:   Future    Number of Occurrences:   1    Standing Expiration Date:   08/24/2022   Folate, Serum    Standing Status:   Future    Number of Occurrences:   1    Standing Expiration Date:   08/24/2022   Hepatitis B core antibody, total    Standing Status:   Future    Number of Occurrences:   1    Standing Expiration Date:   08/24/2022   Hepatitis C  antibody    Standing Status:   Future    Number of Occurrences:   1    Standing Expiration Date:   08/24/2022   Hepatitis B surface antigen    Standing Status:   Future    Number of Occurrences:   1    Standing Expiration Date:   08/24/2022   Hepatitis B surface antibody    Standing Status:   Future    Number of Occurrences:   1    Standing Expiration Date:   08/24/2022   HIV antibody (with reflex)    Standing Status:   Future    Number of Occurrences:   1    Standing Expiration Date:   08/24/2022   Sedimentation rate    Standing Status:   Future    Number of Occurrences:   1    Standing Expiration Date:   08/24/2022    C-reactive protein    Standing Status:   Future    Number of Occurrences:   1    Standing Expiration Date:   08/24/2022    All questions were answered. The patient knows to call the clinic with any problems, questions or concerns.  A total of more than 60 minutes were spent on this encounter with face-to-face time and non-face-to-face time, including preparing to see the patient, ordering tests and/or medications, counseling the patient and coordination of care as outlined above.   Ledell Peoples, MD Department of Hematology/Oncology [place] [facility] Phone: [phone] Pager: [phone] Email: [email or web address]  08/28/2021 1:12 PM

## 2021-08-26 ENCOUNTER — Telehealth: Payer: Self-pay | Admitting: *Deleted

## 2021-08-26 NOTE — Telephone Encounter (Signed)
TCT patient regarding recent lab results. Spoke with her and advised all labs were normal with no clear cause for decreased WBC. Advised we will see her back in 6 months and to call us with any change in condition/new symptoms  Pt voiced understanding.

## 2021-08-26 NOTE — Telephone Encounter (Signed)
-----   Message from [name], MD sent at 08/26/2021  8:41 AM EDT ----- Please let Madeline Cowan know that her blood work did not show a clear cause for her low white blood cell count.  Her nutritional levels are normal and she has no evidence of hepatitis B, hepatitis C, or HIV.  At this time would recommend continued observation.  We will have her return to clinic in approximately 6 months time to reevaluate.  ----- Message ----- From: Interface, Lab In [place] Sent: 08/24/2021  10:08 AM EDT To: [name], MD

## 2021-09-23 DIAGNOSIS — Z3042 Encounter for surveillance of injectable contraceptive: Secondary | ICD-10-CM | POA: Diagnosis not present

## 2021-10-24 ENCOUNTER — Ambulatory Visit
Admission: RE | Admit: 2021-10-24 | Discharge: 2021-10-24 | Disposition: A | Discharge status: Home / Self Care | Payer: BC Managed Care – PPO | Source: Ambulatory Visit | Attending: Family Medicine | Admitting: Family Medicine

## 2021-10-24 ENCOUNTER — Other Ambulatory Visit: Payer: Self-pay | Admitting: Family Medicine

## 2021-10-24 DIAGNOSIS — M79662 Pain in left lower leg: Secondary | ICD-10-CM

## 2021-10-24 DIAGNOSIS — D72819 Decreased white blood cell count, unspecified: Secondary | ICD-10-CM | POA: Diagnosis not present

## 2021-10-24 DIAGNOSIS — R110 Nausea: Secondary | ICD-10-CM | POA: Diagnosis not present

## 2021-10-24 DIAGNOSIS — E559 Vitamin D deficiency, unspecified: Secondary | ICD-10-CM | POA: Diagnosis not present

## 2021-10-24 DIAGNOSIS — R509 Fever, unspecified: Secondary | ICD-10-CM | POA: Diagnosis not present

## 2021-10-25 ENCOUNTER — Telehealth: Payer: Self-pay | Admitting: Hematology and Oncology

## 2021-10-25 NOTE — Telephone Encounter (Signed)
Scheduled appt per 8/1 referral. Pt was already established with Dr. Leonides Schanz. Pt is aware of appt date and time. Pt is aware to arrive 15 mins prior to appt time and to bring and updated insurance card. Pt is aware of appt location.

## 2021-10-27 DIAGNOSIS — N898 Other specified noninflammatory disorders of vagina: Secondary | ICD-10-CM | POA: Diagnosis not present

## 2021-10-27 DIAGNOSIS — L709 Acne, unspecified: Secondary | ICD-10-CM | POA: Diagnosis not present

## 2021-10-27 DIAGNOSIS — R102 Pelvic and perineal pain: Secondary | ICD-10-CM | POA: Diagnosis not present

## 2021-11-11 DIAGNOSIS — E559 Vitamin D deficiency, unspecified: Secondary | ICD-10-CM | POA: Diagnosis not present

## 2021-11-14 DIAGNOSIS — R102 Pelvic and perineal pain: Secondary | ICD-10-CM | POA: Diagnosis not present

## 2021-11-25 ENCOUNTER — Inpatient Hospital Stay: Payer: BC Managed Care – PPO

## 2021-11-25 ENCOUNTER — Other Ambulatory Visit: Payer: Self-pay

## 2021-11-25 ENCOUNTER — Other Ambulatory Visit: Payer: Self-pay | Admitting: Hematology and Oncology

## 2021-11-25 ENCOUNTER — Inpatient Hospital Stay: Payer: BC Managed Care – PPO | Attending: Hematology and Oncology | Admitting: Hematology and Oncology

## 2021-11-25 VITALS — BP 118/71 | HR 80 | Temp 98.4°F | Resp 15 | Wt 130.9 lb

## 2021-11-25 DIAGNOSIS — R630 Anorexia: Secondary | ICD-10-CM | POA: Diagnosis not present

## 2021-11-25 DIAGNOSIS — D509 Iron deficiency anemia, unspecified: Secondary | ICD-10-CM | POA: Diagnosis not present

## 2021-11-25 DIAGNOSIS — R110 Nausea: Secondary | ICD-10-CM | POA: Diagnosis not present

## 2021-11-25 DIAGNOSIS — D709 Neutropenia, unspecified: Secondary | ICD-10-CM

## 2021-11-25 DIAGNOSIS — D500 Iron deficiency anemia secondary to blood loss (chronic): Secondary | ICD-10-CM | POA: Insufficient documentation

## 2021-11-25 DIAGNOSIS — N920 Excessive and frequent menstruation with regular cycle: Secondary | ICD-10-CM | POA: Insufficient documentation

## 2021-11-25 LAB — CBC WITH DIFFERENTIAL (CANCER CENTER ONLY)
Abs Immature Granulocytes: 0.01 10*3/uL (ref 0.00–0.07)
Basophils Absolute: 0 10*3/uL (ref 0.0–0.1)
Basophils Relative: 0 %
Eosinophils Absolute: 0.1 10*3/uL (ref 0.0–0.5)
Eosinophils Relative: 1 %
HCT: 36.6 % (ref 36.0–46.0)
Hemoglobin: 12.2 g/dL (ref 12.0–15.0)
Immature Granulocytes: 0 %
Lymphocytes Relative: 42 %
Lymphs Abs: 2 10*3/uL (ref 0.7–4.0)
MCH: 28.5 pg (ref 26.0–34.0)
MCHC: 33.3 g/dL (ref 30.0–36.0)
MCV: 85.5 fL (ref 80.0–100.0)
Monocytes Absolute: 0.4 10*3/uL (ref 0.1–1.0)
Monocytes Relative: 8 %
Neutro Abs: 2.3 10*3/uL (ref 1.7–7.7)
Neutrophils Relative %: 49 %
Platelet Count: 252 10*3/uL (ref 150–400)
RBC: 4.28 MIL/uL (ref 3.87–5.11)
RDW: 13.7 % (ref 11.5–15.5)
WBC Count: 4.7 10*3/uL (ref 4.0–10.5)
nRBC: 0 % (ref 0.0–0.2)

## 2021-11-25 LAB — CMP (CANCER CENTER ONLY)
ALT: 15 U/L (ref 0–44)
AST: 19 U/L (ref 15–41)
Albumin: 4.9 g/dL (ref 3.5–5.0)
Alkaline Phosphatase: 55 U/L (ref 38–126)
Anion gap: 5 (ref 5–15)
BUN: 6 mg/dL (ref 6–20)
CO2: 26 mmol/L (ref 22–32)
Calcium: 10.2 mg/dL (ref 8.9–10.3)
Chloride: 106 mmol/L (ref 98–111)
Creatinine: 0.69 mg/dL (ref 0.44–1.00)
GFR, Estimated: >60 mL/min (ref >60)
Glucose, Bld: 88 mg/dL (ref 70–99)
Potassium: 3.4 mmol/L — ABNORMAL LOW (ref 3.5–5.1)
Sodium: 137 mmol/L (ref 135–145)
Total Bilirubin: 0.7 mg/dL (ref 0.3–1.2)
Total Protein: 7.7 g/dL (ref 6.5–8.1)

## 2021-11-25 LAB — VITAMIN B12: Vitamin B-12: 284 pg/mL (ref 180–914)

## 2021-11-25 LAB — SEDIMENTATION RATE: Sed Rate: 5 mm/hr (ref 0–22)

## 2021-11-25 LAB — C-REACTIVE PROTEIN: CRP: <0.5 mg/dL (ref <1.0)

## 2021-11-26 LAB — T4: T4, Total: 6.3 ug/dL (ref 4.5–12.0)

## 2021-11-29 LAB — TSH: TSH: 2.407 u[IU]/mL (ref 0.350–4.500)

## 2021-11-29 LAB — METHYLMALONIC ACID, SERUM: Methylmalonic Acid, Quantitative: 57 nmol/L (ref 0–378)

## 2021-12-04 NOTE — Progress Notes (Signed)
[place] [phone]   [phone]  PROGRESS NOTE  Patient Care Team: Patient, No Pcp Per as PCP - General (General Practice)  Hematological/Oncological History # Leukopenia/Neutropenia # Normocytic Anemia  08/08/2021: TIBC 381, Ferritin 14, Iron Sat 32%, WBC 2.6, Hgb 11.5, MCV 87.2, Plt 224, ANC 900 08/24/2021: establish care with Dr. Lorenso Courier  Interval History:  Madeline Cowan 26 y.o. female with medical history significant for mild neutropenia and normocytic anemia who presents for a follow up visit. The patient's last visit was on 08/24/2021. In the interim since the last visit worsening nausea/vomiting and poor appetite.  Today Madeline Cowan reports that she has "not been able to eat anything".  She does that she is drinking Ensure in order to keep her weight up.  She "feels like I been hit by a [place]".  She notes that her fatigue is quite severe and her energy is about a 3 out of 10.  It is gradually worsened since February.  She is having body aches, stomach aches, joint pain and ring-shaped bruises developing on her legs.  She reports that she is also having numbness and tingling of her fingers and toes.  She notes that she is also developed red patches where she has sun exposure.  She notes that she is continue to take her iron pills as well as vitamin D supplementation since May.  Her levels are improving though her symptoms or not.  She reports that she is on Depo shots since August and this is significantly lightened her menstrual cycles.  She is not having any infectious symptoms.  She denies any shortness of breath on exertion.  She also notes that she is taking [place] vitamins and attempt to boost her nutritional status.  Her appetite is slowly been poor.  A full 10 point ROS was otherwise negative.  MEDICAL HISTORY:  Past Medical History:  Diagnosis Date   Bilateral ovarian cysts    Mitral valve problem     SURGICAL HISTORY: No  past surgical history on file.  SOCIAL HISTORY: Social History   Socioeconomic History   Marital status: Single    Spouse name: Not on file   Number of children: Not on file   Years of education: Not on file   Highest education level: Not on file  Occupational History   Not on file  Tobacco Use   Smoking status: Never   Smokeless tobacco: Never  Vaping Use   Vaping Use: Never used  Substance and Sexual Activity   Alcohol use: No   Drug use: No   Sexual activity: Not on file  Other Topics Concern   Not on file  Social History Narrative   Not on file   Social Determinants of Health   Financial Resource Strain: Not on file  Food Insecurity: Not on file  Transportation Needs: Not on file  Physical Activity: Not on file  Stress: Not on file  Social Connections: Not on file  Intimate Partner Violence: Not on file    FAMILY HISTORY: No family history on file.  ALLERGIES:  is allergic to vancomycin.  MEDICATIONS:  Current Outpatient Medications  Medication Sig Dispense Refill   ferrous sulfate 325 (65 FE) MG EC tablet Take 1 tablet by mouth daily.     HAILEY 24 FE 1-20 MG-MCG(24) tablet Take 1 tablet by mouth daily.     Vitamin D, Ergocalciferol, (DRISDOL) 1.25 MG (50000 UNIT) CAPS capsule Take 50,000 Units by mouth once a week.  Aspirin-Acetaminophen-Caffeine (PAMPRIN MAX PO) Take 1 tablet by mouth daily as needed (pain, cramps).     medroxyPROGESTERone Acetate 150 MG/ML SUSY 1 mL (Patient not taking: Reported on 11/25/2021)     ondansetron (ZOFRAN-ODT) 8 MG disintegrating tablet Take 1 tablet (8 mg total) by mouth every 8 (eight) hours as needed for nausea or vomiting. (Patient not taking: Reported on 08/24/2021) 12 tablet 0   No current facility-administered medications for this visit.    REVIEW OF SYSTEMS:   All other systems were reviewed with the patient and are negative.  PHYSICAL EXAMINATION:  Vitals:   11/25/21 1525  BP: 118/71  Pulse: 80  Resp: 15   Temp: 98.4 F (36.9 C)  SpO2: 100%   Filed Weights   11/25/21 1525  Weight: 130 lb 14.4 oz (59.4 kg)    GENERAL: Well-appearing young African-American female alert, no distress and comfortable SKIN: skin color, texture, turgor are normal, no rashes or significant lesions EYES: conjunctiva are pink and non-injected, sclera clear LUNGS: clear to auscultation and percussion with normal breathing effort HEART: regular rate & rhythm and no murmurs and no lower extremity edema Musculoskeletal: no cyanosis of digits and no clubbing  PSYCH: alert & oriented x 3, fluent speech NEURO: no focal motor/sensory deficits  LABORATORY DATA:  I have reviewed the data as listed    Latest Ref Rng & Units 11/25/2021    4:18 PM 08/24/2021    9:44 AM 03/24/2021   10:57 PM  CBC  WBC 4.0 - 10.5 K/uL 4.7  2.6  4.6   Hemoglobin 12.0 - 15.0 g/dL 12.2  12.0  11.7   Hematocrit 36.0 - 46.0 % 36.6  37.1  37.9   Platelets 150 - 400 K/uL 252  228  239        Latest Ref Rng & Units 11/25/2021    4:18 PM 08/24/2021    9:44 AM 03/24/2021   11:30 PM  CMP  Glucose 70 - 99 mg/dL 88  97  98   BUN 6 - 20 mg/dL $Remove'6  8  8   'RVuSAGZ$ Creatinine 0.44 - 1.00 mg/dL 0.69  0.79  0.90   Sodium 135 - 145 mmol/L 137  139  137   Potassium 3.5 - 5.1 mmol/L 3.4  3.8  3.7   Chloride 98 - 111 mmol/L 106  106  105   CO2 22 - 32 mmol/L $RemoveB'26  28  25   'ZTZWykEh$ Calcium 8.9 - 10.3 mg/dL 10.2  10.2  9.7   Total Protein 6.5 - 8.1 g/dL 7.7  7.5  6.9   Total Bilirubin 0.3 - 1.2 mg/dL 0.7  0.5  0.6   Alkaline Phos 38 - 126 U/L 55  51  67   AST 15 - 41 U/L $Remo'19  14  17   'qBpkP$ ALT 0 - 44 U/L $Remo'15  14  18     'YcQly$ No results found for: "MPROTEIN" No results found for: "KPAFRELGTCHN", "LAMBDASER", "KAPLAMBRATIO"   RADIOGRAPHIC STUDIES: No results found.  ASSESSMENT & PLAN Madeline Cowan 26 y.o. female with medical history significant for mild neutropenia and normocytic anemia who presents for a follow up visit.  #Nausea/Poor Appetite -- Unclear this is related  to underlying hematological disorder.  Our inflammatory work-up and nutritional work-ups were unremarkable.  Additionally patient has no abnormalities in thyroid -- Recommend continued work-up with primary care provider.  May need to consider GI evaluation for gastroparesis or other etiology.  #Neutropenia/Leukopenia- resolved --repeat CBC and CMP today --Today  levels have normalized with white blood cell count 4.7 and ANC 2.3 --infectious serology testing with Hep B, Hep C, and HIV was negative previously.  --nutritional evaluation with Vitamin b12, folate unremarkable --inflammatory workup with ESR and CRP was also negative --RTC as needed   # Iron Deficiency Anemia 2/2 to GYN Bleeding --noted to have TIBC 381, Ferritin 14, Iron Sat 32% with Hgb 11.5 on last labs checked.  --patient is taking PO ferrous sulfate 325 mg daily  Orders Placed This Encounter  Procedures   TSH    Standing Status:   Future    Number of Occurrences:   1    Standing Expiration Date:   11/26/2022   T4    Standing Status:   Future    Number of Occurrences:   1    Standing Expiration Date:   11/25/2022   Sedimentation rate    Standing Status:   Future    Number of Occurrences:   1    Standing Expiration Date:   11/25/2022   C-reactive protein    Standing Status:   Future    Number of Occurrences:   1    Standing Expiration Date:   11/25/2022   Methylmalonic acid, serum    Standing Status:   Future    Number of Occurrences:   1    Standing Expiration Date:   11/25/2022   Vitamin B12    Standing Status:   Future    Number of Occurrences:   1    Standing Expiration Date:   11/25/2022    All questions were answered. The patient knows to call the clinic with any problems, questions or concerns.  A total of more than 30 minutes were spent on this encounter with face-to-face time and non-face-to-face time, including preparing to see the patient, ordering tests and/or medications, counseling the patient and  coordination of care as outlined above.   Ledell Peoples, MD Department of Hematology/Oncology [place] [facility] Phone: [phone] Pager: [phone] Email: [email or web address]  12/04/2021 6:00 PM

## 2021-12-05 ENCOUNTER — Telehealth: Payer: Self-pay | Admitting: *Deleted

## 2021-12-05 NOTE — Telephone Encounter (Signed)
-----   Message from [name], MD sent at 12/04/2021  6:01 PM EDT ----- Regarding: P2 Please let Madeline Cowan know that her work-up was unremarkable.  We did not find any issues with inflammatory disorders or nutritional deficiencies.  Additionally her neutrophil count has normalized.  If she is continuing to have issues with her appetite and nausea would recommend she discuss with her primary care provider and consider GI referral for further work-up.  ----- Message ----- From: Interface, Lab In [place] Sent: 11/25/2021   4:21 PM EDT To: [name], MD

## 2021-12-05 NOTE — Telephone Encounter (Signed)
TCT patient regarding recent lab results.  No answer but was able to leave vm message for pt to return this call to [phone] at her earliest convenience to review lab results.

## 2022-01-13 ENCOUNTER — Ambulatory Visit (HOSPITAL_COMMUNITY)
Admission: EM | Admit: 2022-01-13 | Discharge: 2022-01-13 | Disposition: A | Discharge status: Home / Self Care | Payer: BC Managed Care – PPO | Attending: Family Medicine | Admitting: Family Medicine

## 2022-01-13 ENCOUNTER — Encounter (HOSPITAL_COMMUNITY): Payer: Self-pay

## 2022-01-13 ENCOUNTER — Ambulatory Visit (INDEPENDENT_AMBULATORY_CARE_PROVIDER_SITE_OTHER): Payer: BC Managed Care – PPO

## 2022-01-13 DIAGNOSIS — R519 Headache, unspecified: Secondary | ICD-10-CM

## 2022-01-13 DIAGNOSIS — M79645 Pain in left finger(s): Secondary | ICD-10-CM | POA: Diagnosis not present

## 2022-01-13 MED ORDER — TIZANIDINE HCL 4 MG PO TABS: 4.0000 mg | ORAL_TABLET | Freq: Four times a day (QID) | ORAL | 0 refills | Status: DC | PRN
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
## 2022-01-25 DIAGNOSIS — N200 Calculus of kidney: Secondary | ICD-10-CM | POA: Diagnosis not present

## 2022-02-13 DIAGNOSIS — N202 Calculus of kidney with calculus of ureter: Secondary | ICD-10-CM | POA: Diagnosis not present

## 2022-02-21 DIAGNOSIS — N926 Irregular menstruation, unspecified: Secondary | ICD-10-CM | POA: Diagnosis not present

## 2022-02-21 DIAGNOSIS — Z3041 Encounter for surveillance of contraceptive pills: Secondary | ICD-10-CM | POA: Diagnosis not present

## 2022-03-17 ENCOUNTER — Ambulatory Visit (HOSPITAL_COMMUNITY)
Admission: EM | Admit: 2022-03-17 | Discharge: 2022-03-17 | Disposition: A | Discharge status: Home / Self Care | Payer: BC Managed Care – PPO | Attending: Internal Medicine | Admitting: Internal Medicine

## 2022-03-17 ENCOUNTER — Encounter (HOSPITAL_COMMUNITY): Payer: Self-pay | Admitting: *Deleted

## 2022-03-17 DIAGNOSIS — M79604 Pain in right leg: Secondary | ICD-10-CM

## 2022-03-17 DIAGNOSIS — R42 Dizziness and giddiness: Secondary | ICD-10-CM

## 2022-03-17 DIAGNOSIS — R519 Headache, unspecified: Secondary | ICD-10-CM

## 2022-03-17 DIAGNOSIS — M79605 Pain in left leg: Secondary | ICD-10-CM

## 2022-03-17 MED ORDER — BACLOFEN 10 MG PO TABS: 10.0000 mg | ORAL_TABLET | Freq: Three times a day (TID) | ORAL | 0 refills | Status: DC

## 2022-03-17 MED ORDER — ACETAMINOPHEN 325 MG PO TABS
ORAL_TABLET | ORAL | Status: AC
  Filled 2022-03-17: qty 3

## 2022-03-17 MED ORDER — KETOROLAC TROMETHAMINE 30 MG/ML IJ SOLN
INTRAMUSCULAR | Status: AC
  Filled 2022-03-17: qty 1

## 2022-03-17 MED ORDER — ACETAMINOPHEN 325 MG PO TABS
975.0000 mg | ORAL_TABLET | Freq: Once | ORAL | Status: AC
  Administered 2022-03-17: 975 mg via ORAL

## 2022-03-17 MED ORDER — KETOROLAC TROMETHAMINE 30 MG/ML IJ SOLN
30.0000 mg | Freq: Once | INTRAMUSCULAR | Status: AC
  Administered 2022-03-17: 30 mg via INTRAMUSCULAR
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
## 2022-03-23 DIAGNOSIS — R519 Headache, unspecified: Secondary | ICD-10-CM | POA: Diagnosis not present

## 2022-03-23 DIAGNOSIS — M542 Cervicalgia: Secondary | ICD-10-CM | POA: Diagnosis not present

## 2022-04-03 ENCOUNTER — Encounter: Payer: Self-pay | Admitting: Neurology

## 2022-04-03 ENCOUNTER — Ambulatory Visit (INDEPENDENT_AMBULATORY_CARE_PROVIDER_SITE_OTHER): Payer: BC Managed Care – PPO | Admitting: Neurology

## 2022-04-03 VITALS — BP 117/65 | HR 78 | Ht 66.0 in | Wt 126.0 lb

## 2022-04-03 DIAGNOSIS — G44309 Post-traumatic headache, unspecified, not intractable: Secondary | ICD-10-CM

## 2022-04-03 DIAGNOSIS — L709 Acne, unspecified: Secondary | ICD-10-CM | POA: Insufficient documentation

## 2022-04-03 DIAGNOSIS — S060X0D Concussion without loss of consciousness, subsequent encounter: Secondary | ICD-10-CM

## 2022-04-03 DIAGNOSIS — N83209 Unspecified ovarian cyst, unspecified side: Secondary | ICD-10-CM | POA: Insufficient documentation

## 2022-04-03 DIAGNOSIS — N946 Dysmenorrhea, unspecified: Secondary | ICD-10-CM | POA: Insufficient documentation

## 2022-04-03 MED ORDER — TIZANIDINE HCL 4 MG PO TABS: 4.0000 mg | ORAL_TABLET | Freq: Four times a day (QID) | ORAL | 0 refills | Status: AC | PRN

## 2022-04-03 NOTE — Patient Instructions (Addendum)
Restart Tizanidine 4 mg as Q6 hrs for pain  Continue your other medications  Start neck stretch exercise including Return if worse
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
## 2022-07-14 DIAGNOSIS — Z13220 Encounter for screening for lipoid disorders: Secondary | ICD-10-CM | POA: Diagnosis not present

## 2022-07-14 DIAGNOSIS — R143 Flatulence: Secondary | ICD-10-CM | POA: Diagnosis not present

## 2022-07-14 DIAGNOSIS — K5900 Constipation, unspecified: Secondary | ICD-10-CM | POA: Diagnosis not present

## 2022-07-14 DIAGNOSIS — D72819 Decreased white blood cell count, unspecified: Secondary | ICD-10-CM | POA: Diagnosis not present

## 2022-07-14 DIAGNOSIS — Z0000 Encounter for general adult medical examination without abnormal findings: Secondary | ICD-10-CM | POA: Diagnosis not present

## 2022-07-14 DIAGNOSIS — R109 Unspecified abdominal pain: Secondary | ICD-10-CM | POA: Diagnosis not present

## 2022-07-20 DIAGNOSIS — Z01419 Encounter for gynecological examination (general) (routine) without abnormal findings: Secondary | ICD-10-CM | POA: Diagnosis not present

## 2022-07-20 DIAGNOSIS — Z3041 Encounter for surveillance of contraceptive pills: Secondary | ICD-10-CM | POA: Diagnosis not present

## 2022-08-30 DIAGNOSIS — F332 Major depressive disorder, recurrent severe without psychotic features: Secondary | ICD-10-CM | POA: Diagnosis not present

## 2022-08-30 DIAGNOSIS — F411 Generalized anxiety disorder: Secondary | ICD-10-CM | POA: Diagnosis not present

## 2022-09-05 DIAGNOSIS — F411 Generalized anxiety disorder: Secondary | ICD-10-CM | POA: Diagnosis not present

## 2022-09-05 DIAGNOSIS — F332 Major depressive disorder, recurrent severe without psychotic features: Secondary | ICD-10-CM | POA: Diagnosis not present

## 2022-09-12 DIAGNOSIS — F332 Major depressive disorder, recurrent severe without psychotic features: Secondary | ICD-10-CM | POA: Diagnosis not present

## 2022-09-12 DIAGNOSIS — F411 Generalized anxiety disorder: Secondary | ICD-10-CM | POA: Diagnosis not present

## 2022-09-18 DIAGNOSIS — F411 Generalized anxiety disorder: Secondary | ICD-10-CM | POA: Diagnosis not present

## 2022-09-18 DIAGNOSIS — F332 Major depressive disorder, recurrent severe without psychotic features: Secondary | ICD-10-CM | POA: Diagnosis not present

## 2022-10-03 DIAGNOSIS — F332 Major depressive disorder, recurrent severe without psychotic features: Secondary | ICD-10-CM | POA: Diagnosis not present

## 2022-10-03 DIAGNOSIS — F411 Generalized anxiety disorder: Secondary | ICD-10-CM | POA: Diagnosis not present

## 2022-10-13 DIAGNOSIS — F411 Generalized anxiety disorder: Secondary | ICD-10-CM | POA: Diagnosis not present

## 2022-10-13 DIAGNOSIS — F332 Major depressive disorder, recurrent severe without psychotic features: Secondary | ICD-10-CM | POA: Diagnosis not present

## 2022-10-17 DIAGNOSIS — F332 Major depressive disorder, recurrent severe without psychotic features: Secondary | ICD-10-CM | POA: Diagnosis not present

## 2022-10-17 DIAGNOSIS — F411 Generalized anxiety disorder: Secondary | ICD-10-CM | POA: Diagnosis not present

## 2022-10-20 DIAGNOSIS — K5904 Chronic idiopathic constipation: Secondary | ICD-10-CM | POA: Diagnosis not present

## 2022-10-31 DIAGNOSIS — F411 Generalized anxiety disorder: Secondary | ICD-10-CM | POA: Diagnosis not present

## 2022-10-31 DIAGNOSIS — F332 Major depressive disorder, recurrent severe without psychotic features: Secondary | ICD-10-CM | POA: Diagnosis not present

## 2022-11-13 DIAGNOSIS — F411 Generalized anxiety disorder: Secondary | ICD-10-CM | POA: Diagnosis not present

## 2022-11-13 DIAGNOSIS — F332 Major depressive disorder, recurrent severe without psychotic features: Secondary | ICD-10-CM | POA: Diagnosis not present

## 2022-11-28 DIAGNOSIS — F332 Major depressive disorder, recurrent severe without psychotic features: Secondary | ICD-10-CM | POA: Diagnosis not present

## 2022-11-28 DIAGNOSIS — F411 Generalized anxiety disorder: Secondary | ICD-10-CM | POA: Diagnosis not present

## 2022-12-11 DIAGNOSIS — F332 Major depressive disorder, recurrent severe without psychotic features: Secondary | ICD-10-CM | POA: Diagnosis not present

## 2022-12-11 DIAGNOSIS — F411 Generalized anxiety disorder: Secondary | ICD-10-CM | POA: Diagnosis not present

## 2022-12-26 DIAGNOSIS — F411 Generalized anxiety disorder: Secondary | ICD-10-CM | POA: Diagnosis not present

## 2022-12-26 DIAGNOSIS — F332 Major depressive disorder, recurrent severe without psychotic features: Secondary | ICD-10-CM | POA: Diagnosis not present

## 2023-01-09 DIAGNOSIS — F332 Major depressive disorder, recurrent severe without psychotic features: Secondary | ICD-10-CM | POA: Diagnosis not present

## 2023-01-09 DIAGNOSIS — F411 Generalized anxiety disorder: Secondary | ICD-10-CM | POA: Diagnosis not present

## 2023-01-17 DIAGNOSIS — Z23 Encounter for immunization: Secondary | ICD-10-CM | POA: Diagnosis not present

## 2023-02-05 DIAGNOSIS — F1290 Cannabis use, unspecified, uncomplicated: Secondary | ICD-10-CM | POA: Diagnosis not present

## 2023-02-05 DIAGNOSIS — E559 Vitamin D deficiency, unspecified: Secondary | ICD-10-CM | POA: Diagnosis not present

## 2023-02-05 DIAGNOSIS — R5383 Other fatigue: Secondary | ICD-10-CM | POA: Diagnosis not present

## 2023-02-05 DIAGNOSIS — D509 Iron deficiency anemia, unspecified: Secondary | ICD-10-CM | POA: Diagnosis not present

## 2023-07-18 DIAGNOSIS — F339 Major depressive disorder, recurrent, unspecified: Secondary | ICD-10-CM | POA: Diagnosis not present

## 2023-07-18 DIAGNOSIS — F1290 Cannabis use, unspecified, uncomplicated: Secondary | ICD-10-CM | POA: Diagnosis not present

## 2023-07-18 DIAGNOSIS — Z0000 Encounter for general adult medical examination without abnormal findings: Secondary | ICD-10-CM | POA: Diagnosis not present

## 2023-07-18 DIAGNOSIS — Z13220 Encounter for screening for lipoid disorders: Secondary | ICD-10-CM | POA: Diagnosis not present

## 2023-07-18 DIAGNOSIS — D649 Anemia, unspecified: Secondary | ICD-10-CM | POA: Diagnosis not present

## 2023-07-18 DIAGNOSIS — R1115 Cyclical vomiting syndrome unrelated to migraine: Secondary | ICD-10-CM | POA: Diagnosis not present

## 2023-07-18 DIAGNOSIS — Z1159 Encounter for screening for other viral diseases: Secondary | ICD-10-CM | POA: Diagnosis not present

## 2023-07-31 DIAGNOSIS — Z113 Encounter for screening for infections with a predominantly sexual mode of transmission: Secondary | ICD-10-CM | POA: Diagnosis not present

## 2023-07-31 DIAGNOSIS — Z01419 Encounter for gynecological examination (general) (routine) without abnormal findings: Secondary | ICD-10-CM | POA: Diagnosis not present

## 2023-07-31 DIAGNOSIS — N898 Other specified noninflammatory disorders of vagina: Secondary | ICD-10-CM | POA: Diagnosis not present

## 2023-10-08 ENCOUNTER — Other Ambulatory Visit (HOSPITAL_BASED_OUTPATIENT_CLINIC_OR_DEPARTMENT_OTHER): Payer: Self-pay

## 2023-10-08 MED ORDER — IBUPROFEN 800 MG PO TABS
800.0000 mg | ORAL_TABLET | Freq: Three times a day (TID) | ORAL | 3 refills | Status: AC | PRN
  Filled 2023-10-08: qty 30, 10d supply, fill #0

## 2023-11-15 IMAGING — CT CT ABD-PELV W/ CM
2 of 4 series · 16 of 46 positions shown, 18 images · IV contrast (APPLIED)
Comparison: None

CLINICAL DATA: Right lower quadrant abdominal pain.

EXAM:
CT ABDOMEN AND PELVIS WITH CONTRAST
TECHNIQUE: Multidetector CT imaging of the abdomen and pelvis was performed
using the standard protocol following bolus administration of
intravenous contrast.
CONTRAST:  100mL OMNIPAQUE IOHEXOL 300 MG/ML  SOLN

[Series 3: abdomen 5.0 · axial · 0.58mm/px · z∈[-562,-192]mm · 13 of 84 slices shown, 15 images]
[im 5/84  soft-tissue]
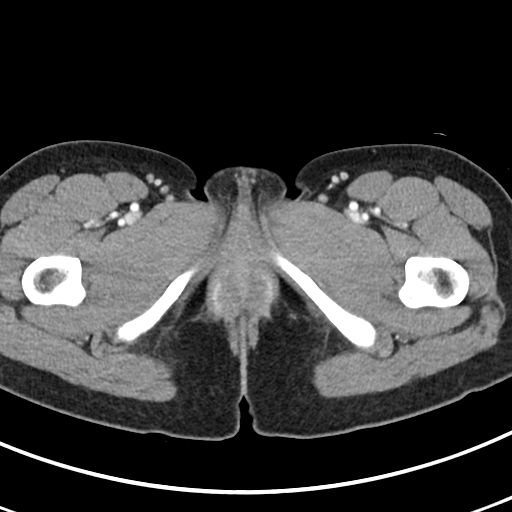
[im 5/84  bone]
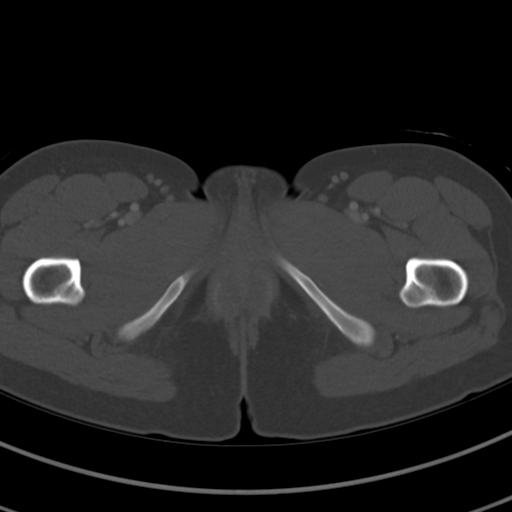
[im 14/84  soft-tissue]
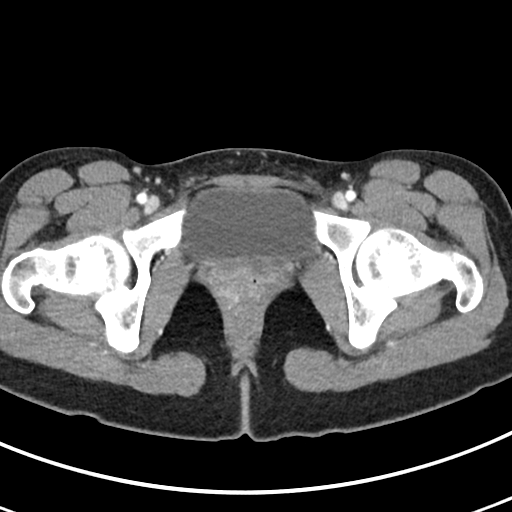
[im 18/84  soft-tissue]
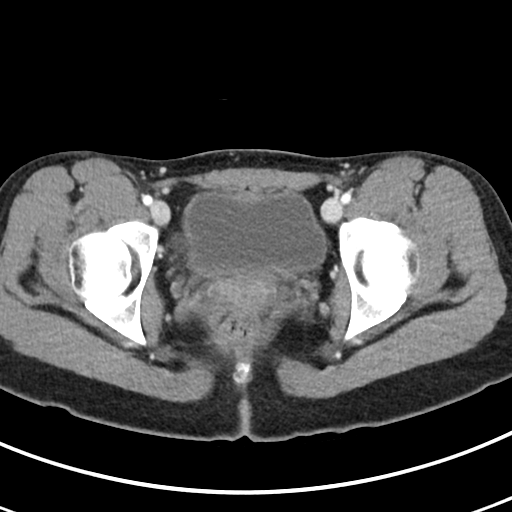
[im 22/84  soft-tissue]
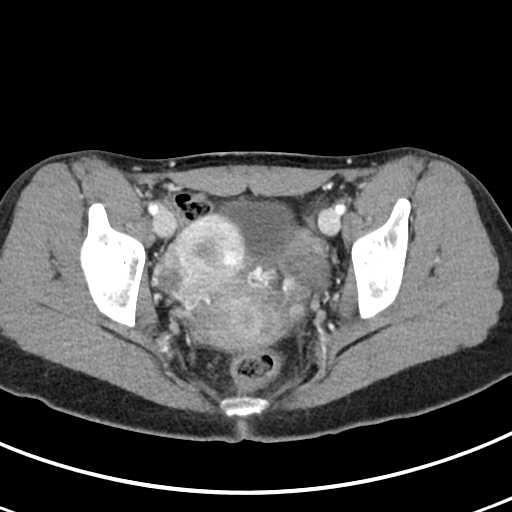
[im 31/84  soft-tissue]
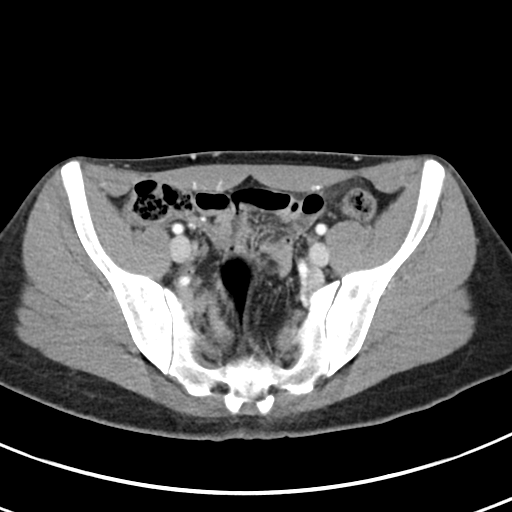
[im 35/84  soft-tissue]
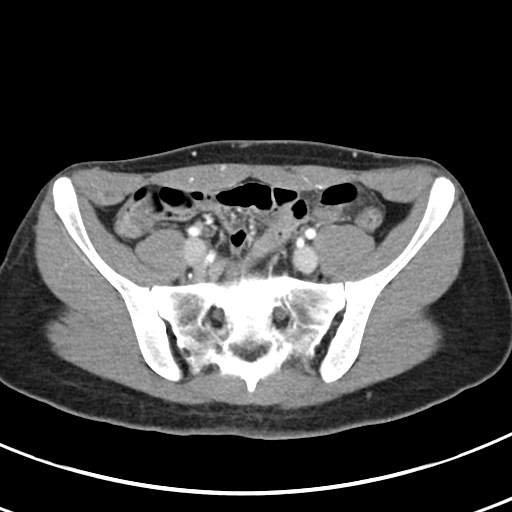
[im 44/84  soft-tissue]
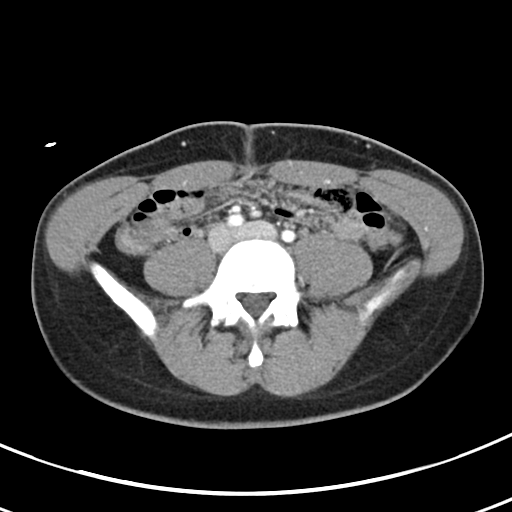
[im 49/84  soft-tissue]
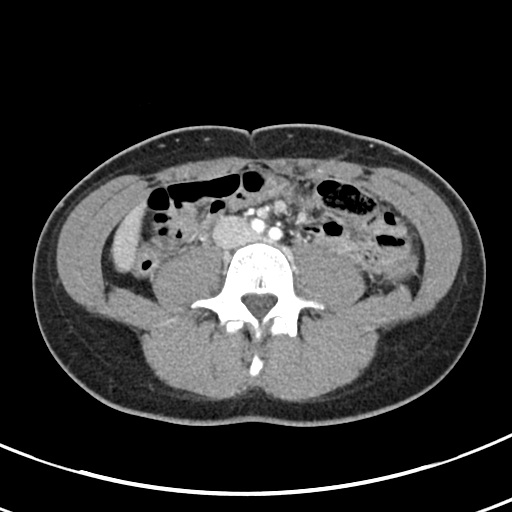
[im 53/84  soft-tissue]
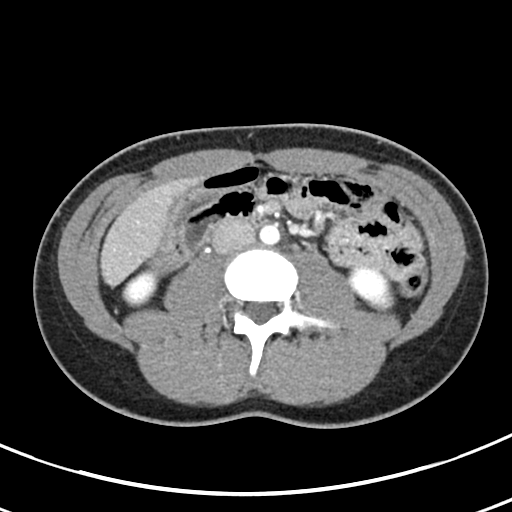
[im 53/84  bone]
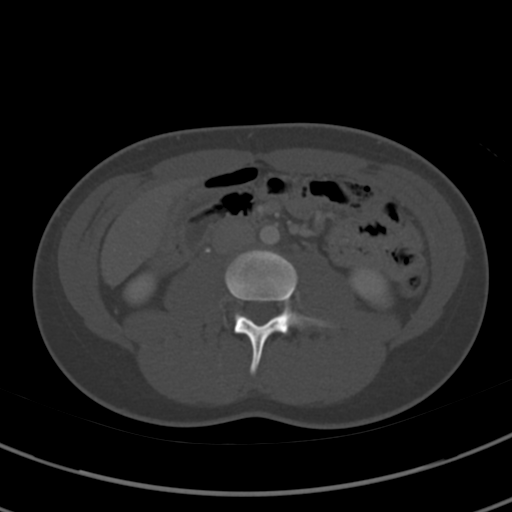
[im 62/84  soft-tissue]
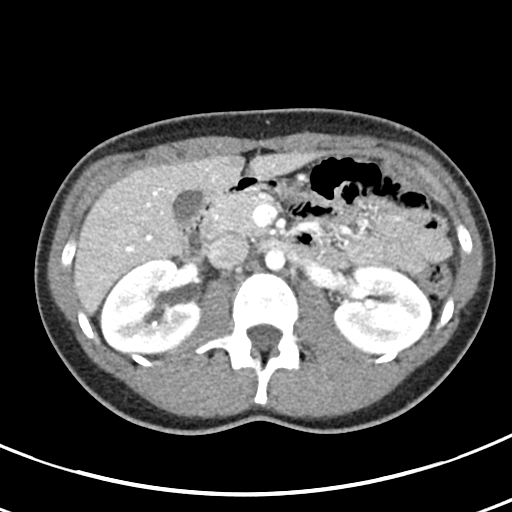
[im 66/84  soft-tissue]
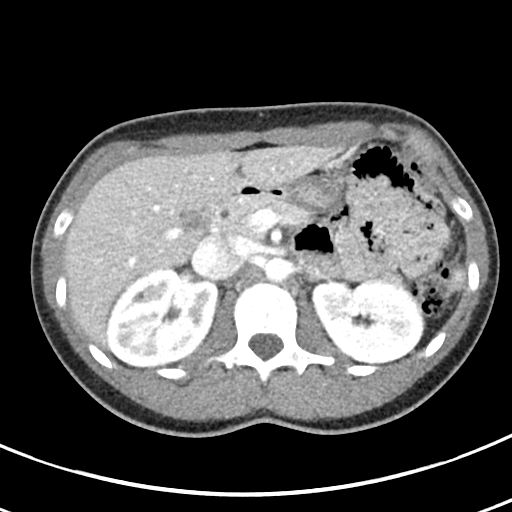
[im 70/84  soft-tissue]
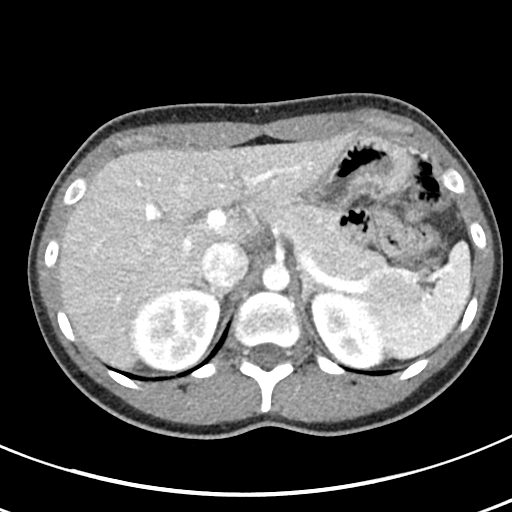
[im 79/84  soft-tissue]
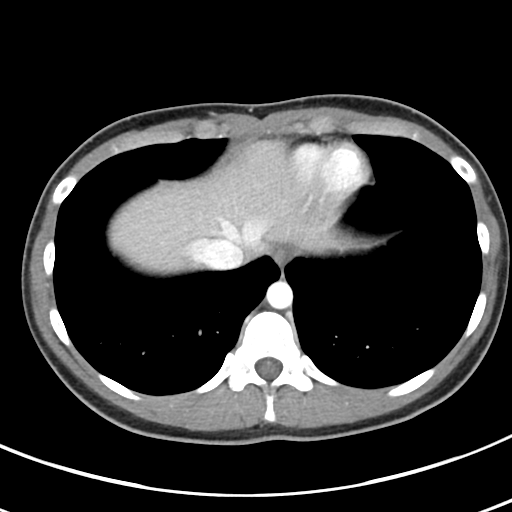

[Series 6: abdomen 3.0 mpr cor · coronal · 0.73mm/px · 3 of 71 slices shown]
[im 24/71  soft-tissue]
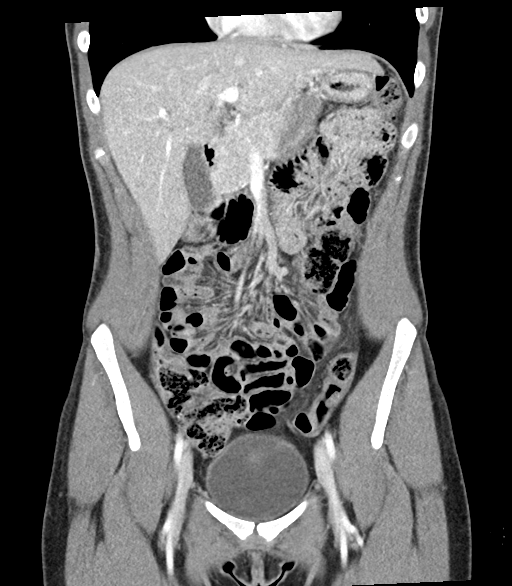
[im 32/71  soft-tissue]
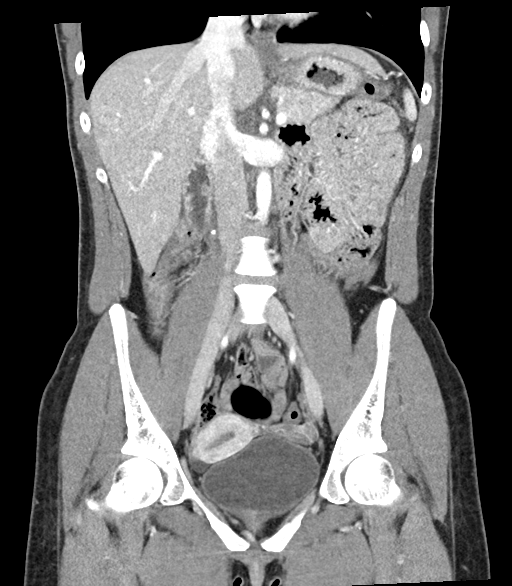
[im 39/71  soft-tissue]
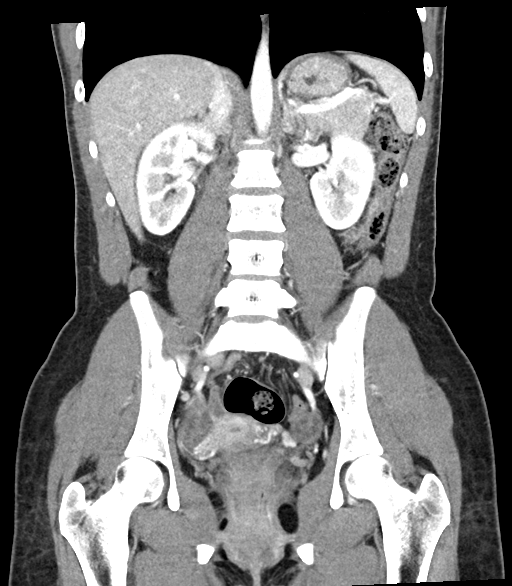

[16 of 46 positions shown; findings below may reference images not displayed]

FINDINGS: Lower chest: The visualized lung bases are clear.

No intra-abdominal free air or free fluid.

Hepatobiliary: No focal liver abnormality is seen. No gallstones,
gallbladder wall thickening, or biliary dilatation.

Pancreas: Unremarkable. No pancreatic ductal dilatation or
surrounding inflammatory changes.

Spleen: Normal in size without focal abnormality.

Adrenals/Urinary Tract: The adrenal glands unremarkable. There is a
4 mm stone in the proximal right ureter with mild right
hydronephrosis. Several additional nonobstructing right renal
calculi measure up to mm. The left kidney is unremarkable. The
urinary bladder is grossly unremarkable.

Stomach/Bowel: There is no bowel obstruction or active inflammation.
The appendix is normal.

Vascular/Lymphatic: The abdominal aorta and IVC unremarkable. No
portal venous gas. There is no adenopathy.

Reproductive: The uterus is anteverted.  No adnexal masses.

Other: None

Musculoskeletal: No acute or significant osseous findings.
IMPRESSION: 1. A 4 mm proximal right ureteral stone with mild right
hydronephrosis. Several additional nonobstructing right renal
calculi measure up to mm.
2. No bowel obstruction. Normal appendix.
